# Patient Record
Sex: Female | Born: 1950 | Race: White | Hispanic: No | Marital: Married | Smoking: Never smoker
Health system: Southern US, Community
[De-identification: ages and names within clinical notes are randomized; demographics above are authoritative.]

## PROBLEM LIST (undated history)

## (undated) DIAGNOSIS — E785 Hyperlipidemia, unspecified: Secondary | ICD-10-CM

## (undated) HISTORY — PX: DILATION AND CURETTAGE OF UTERUS: SHX78

---

## 1999-01-27 ENCOUNTER — Other Ambulatory Visit: Admission: RE | Admit: 1999-01-27 | Discharge: 1999-01-27 | Payer: Self-pay | Admitting: Obstetrics & Gynecology

## 2000-02-03 ENCOUNTER — Other Ambulatory Visit: Admission: RE | Admit: 2000-02-03 | Discharge: 2000-02-03 | Payer: Self-pay | Admitting: Obstetrics & Gynecology

## 2000-02-06 ENCOUNTER — Other Ambulatory Visit: Admission: RE | Admit: 2000-02-06 | Discharge: 2000-02-06 | Payer: Self-pay | Admitting: Obstetrics & Gynecology

## 2000-02-06 ENCOUNTER — Encounter (INDEPENDENT_AMBULATORY_CARE_PROVIDER_SITE_OTHER): Payer: Self-pay

## 2001-02-13 ENCOUNTER — Other Ambulatory Visit: Admission: RE | Admit: 2001-02-13 | Discharge: 2001-02-13 | Payer: Self-pay | Admitting: Obstetrics & Gynecology

## 2002-03-27 ENCOUNTER — Other Ambulatory Visit: Admission: RE | Admit: 2002-03-27 | Discharge: 2002-03-27 | Payer: Self-pay | Admitting: Obstetrics & Gynecology

## 2003-06-24 ENCOUNTER — Other Ambulatory Visit: Admission: RE | Admit: 2003-06-24 | Discharge: 2003-06-24 | Payer: Self-pay | Admitting: Obstetrics & Gynecology

## 2005-01-02 ENCOUNTER — Other Ambulatory Visit: Admission: RE | Admit: 2005-01-02 | Discharge: 2005-01-02 | Payer: Self-pay | Admitting: Obstetrics & Gynecology

## 2005-05-24 ENCOUNTER — Encounter: Admission: RE | Admit: 2005-05-24 | Discharge: 2005-05-24 | Payer: Self-pay | Admitting: Obstetrics & Gynecology

## 2006-05-28 ENCOUNTER — Encounter: Admission: RE | Admit: 2006-05-28 | Discharge: 2006-05-28 | Payer: Self-pay | Admitting: Obstetrics & Gynecology

## 2006-11-14 ENCOUNTER — Emergency Department (HOSPITAL_COMMUNITY): Admission: EM | Admit: 2006-11-14 | Discharge: 2006-11-14 | Payer: Self-pay | Admitting: Family Medicine

## 2007-09-27 ENCOUNTER — Encounter: Admission: RE | Admit: 2007-09-27 | Discharge: 2007-09-27 | Payer: Self-pay | Admitting: Obstetrics & Gynecology

## 2007-10-08 ENCOUNTER — Encounter: Admission: RE | Admit: 2007-10-08 | Discharge: 2007-10-08 | Payer: Self-pay | Admitting: Obstetrics & Gynecology

## 2008-05-08 ENCOUNTER — Ambulatory Visit (HOSPITAL_BASED_OUTPATIENT_CLINIC_OR_DEPARTMENT_OTHER): Admission: RE | Admit: 2008-05-08 | Discharge: 2008-05-08 | Payer: Self-pay | Admitting: Orthopedic Surgery

## 2009-01-06 ENCOUNTER — Encounter: Admission: RE | Admit: 2009-01-06 | Discharge: 2009-01-06 | Payer: Self-pay | Admitting: Obstetrics & Gynecology

## 2010-03-21 ENCOUNTER — Encounter: Payer: Self-pay | Admitting: Obstetrics & Gynecology

## 2010-05-20 ENCOUNTER — Other Ambulatory Visit: Payer: Self-pay | Admitting: Obstetrics & Gynecology

## 2010-05-20 DIAGNOSIS — Z1231 Encounter for screening mammogram for malignant neoplasm of breast: Secondary | ICD-10-CM

## 2010-06-24 ENCOUNTER — Ambulatory Visit
Admission: RE | Admit: 2010-06-24 | Discharge: 2010-06-24 | Disposition: A | Discharge status: Home / Self Care | Payer: 59 | Source: Ambulatory Visit | Attending: Obstetrics & Gynecology | Admitting: Obstetrics & Gynecology

## 2010-06-24 DIAGNOSIS — Z1231 Encounter for screening mammogram for malignant neoplasm of breast: Secondary | ICD-10-CM

## 2010-07-12 NOTE — Op Note (Signed)
NAMEFATMA, RUTTEN NO.:  [id number]   MEDICAL RECORD NO.:  [id number]          PATIENT TYPE:  AMB   LOCATION:  DSC                          FACILITY:  [facility]   PHYSICIAN:  Cindee Salt, M.D.       DATE OF BIRTH:  03-04-1950   DATE OF PROCEDURE:  DATE OF DISCHARGE:                               OPERATIVE REPORT   PREOPERATIVE DIAGNOSIS:  Distal radius fracture, right wrist.   POSTOPERATIVE DIAGNOSIS:  Distal radius fracture, right wrist.   OPERATION:  Open reduction and internal fixation with fixation of DDR  standard plate.   SURGEON:  Cindee Salt, MD   ASSISTANT:  Carolyne Fiscal.   ANESTHESIA:  Axillary general.   DATE OF OPERATION:  May 08, 2008.   ANESTHESIOLOGIST:  Dr. Jean Rosenthal.   HISTORY:  The patient is a 60 year old female who suffered a fall with  fracture, comminuted intra-articular of her right distal radius.  She is  admitted now for open reduction and internal fixation.  She is aware of  risks and complications including infection, recurrence of injury to  arteries, nerves, tendons, incomplete relief of symptoms, dystrophy,  nonunion, loss of fixation, hardware problems, possibility of removal of  plate and screws.  In the preoperative area, the patient is seen.  The  extremity marked by both the patient and surgeon.  Antibiotic given.   PROCEDURE:  The patient was brought to the operating room where an  axillary block was carried out without difficulty.  She was prepped  using DuraPrep, supine position, right arm free.  A time-out taken.  The  limb was exsanguinated with an Esmarch bandage.  Tourniquet placed high  and the arm was inflated to 250 mmHg.  A reduction was performed,  confirmed under image intensification.  The incision was made, carried  down through subcutaneous tissue.  Bleeders were electrocauterized.  This was based on the flexor carpi radialis tendon.  Taken radially and  then distally for the possibility of extended dissection.   Dissection  carried down through the flexor carpi radialis tendon sheath.  The  radial artery was identified and protected.  Pronator quadratus was then  elevated off from the distal radius.  Fracture fragments were  immediately apparent.  This was manipulated in to position.  The ulnar  fragment was somewhat recalcitrant to reduction; however, with the plate  this will be maintained in position.  A DDR plate was then selected.  The small plate was not wide enough to cover both the radial and ulnar  fragments.  A wide plate was then placed.  This was fixed proximally  with a gliding screw after placement with K-wires confirming position.  The 12-mm screw was placed.  The plate was then adjusted and retained  with the fracture in a reduced position.  This firmly fixed the ulnar  fragment in position and care was taken to be certain that this did not  involve the distal radioulnar joint.  The distal screws were placed.  The first screw was a fully threaded locking screw.  This was measured  20 and 18  mm screw was placed, be certain that it did not penetrate the  dorsal cortex.  X-ray was confirmed, but this was not in the distal  radioulnar joint.  The remaining screws were placed.  The next screws  were placed as pegs locking into the distal radius.  These measured  between 18 and 22 mm.  The radial 3 screws were each drilled and  measured.  These measured 18-20 mm and were placed as fully threaded  screws and that they did not penetrate dorsal cortex.  X-rays confirmed  that no screws were in the distal radioulnar joint.  No screws were in  the radiocarpal joint.  With good fixation and realignment of the  articular surface with AP and lateral direction, the remaining proximal  screws were placed.  These measured 12, 12, 10, and 12.  The 12-mm screw  proximally was noted to be slightly long, so it was placed slightly  obliquely.  This was replaced with a 10-mm screw.  This firmly fixed  the  fracture in position, full flexion/extension, radial and ulnar  deviation, pronation and supination of the wrist was afforded.  The  wound was copiously irrigated with saline.  The pronator quadratus was  then repaired with figure-of-eight 4-0 Vicryl sutures, the subcutaneous  tissue with interrupted 4-0 Vicryl, and the skin with interrupted 4-0  Vicryl Rapide sutures.  A sterile compressive dressing and volar splint  was applied.  The patient tolerated the procedure well.  Deflation of  the tourniquet all fingers immediately pinked.  Electrocautery was used  throughout the procedure using bipolar cauterization for hemostasis.  The patient tolerated the procedure well and was taken to the recovery  room for observation in satisfactory condition.  She will be discharged  home to return to the [facility] in 1 week on Percocet.           ______________________________  Cindee Salt, M.D.     GK/MEDQ  D:  05/08/2008  T:  05/09/2008  Job:  [id number]

## 2011-09-01 ENCOUNTER — Encounter (HOSPITAL_COMMUNITY): Payer: Self-pay | Admitting: *Deleted

## 2011-09-01 ENCOUNTER — Emergency Department (HOSPITAL_COMMUNITY): Payer: 59

## 2011-09-01 ENCOUNTER — Other Ambulatory Visit: Payer: Self-pay

## 2011-09-01 ENCOUNTER — Observation Stay (HOSPITAL_COMMUNITY)
Admission: EM | Admit: 2011-09-01 | Discharge: 2011-09-02 | Disposition: A | Discharge status: Home / Self Care | Payer: 59 | Attending: Emergency Medicine | Admitting: Emergency Medicine

## 2011-09-01 DIAGNOSIS — R079 Chest pain, unspecified: Principal | ICD-10-CM | POA: Insufficient documentation

## 2011-09-01 DIAGNOSIS — R61 Generalized hyperhidrosis: Secondary | ICD-10-CM | POA: Insufficient documentation

## 2011-09-01 DIAGNOSIS — E785 Hyperlipidemia, unspecified: Secondary | ICD-10-CM | POA: Insufficient documentation

## 2011-09-01 HISTORY — DX: Hyperlipidemia, unspecified: E78.5

## 2011-09-01 LAB — CBC
HCT: 40.3 % (ref 36.0–46.0)
Hemoglobin: 13.1 g/dL (ref 12.0–15.0)
MCH: 29.3 pg (ref 26.0–34.0)
MCHC: 32.5 g/dL (ref 30.0–36.0)
Platelets: 276 10*3/uL (ref 150–400)
RBC: 4.47 MIL/uL (ref 3.87–5.11)
RDW: 13.2 % (ref 11.5–15.5)

## 2011-09-01 LAB — BASIC METABOLIC PANEL
BUN: 20 mg/dL (ref 6–23)
CO2: 28 mEq/L (ref 19–32)
Calcium: 9.4 mg/dL (ref 8.4–10.5)
Creatinine, Ser: 0.64 mg/dL (ref 0.50–1.10)
Sodium: 141 mEq/L (ref 135–145)

## 2011-09-01 LAB — TROPONIN I: Troponin I: <0.3 ng/mL (ref <0.30)

## 2011-09-01 NOTE — ED Notes (Signed)
Pt ambulatory to bathroom without any problems 

## 2011-09-01 NOTE — ED Provider Notes (Signed)
Medical screening examination/treatment/procedure(s) were conducted as a shared visit with non-physician practitioner(s) and myself.  I personally evaluated the patient during the encounter  Pt seen and examined, ecg without ischemic changes--troponin neg--will arrange f/u testing or obs overnight  Toy Baker, MD 09/01/11 1540

## 2011-09-01 NOTE — ED Notes (Signed)
Old and new EKG given to Dr. Freida Busman  Extra copies placed in pt chart

## 2011-09-01 NOTE — ED Notes (Signed)
Patient reports she was at work and had sudden onset of chest pain and she had onset of diaphoresis.  She states she had not eaten.  Patient states the pain lasted 10 min.  Patient states she feels better now.  She did eat. But she states she feels jittery.  Patient denies sob.  Denies nausea.

## 2011-09-01 NOTE — ED Notes (Signed)
Report received, assumed care.  

## 2011-09-01 NOTE — ED Provider Notes (Signed)
History     CSN: [id number]  Arrival date & time 09/01/11  1205   First MD Initiated Contact with Patient 09/01/11 1218      Chief Complaint  Patient presents with  . Chest Pain    (Consider location/radiation/quality/duration/timing/severity/associated sxs/prior treatment) HPI History from patient. 61 year old female who presents with chest pain. She states that she was standing at work at approximately 8:30 this morning when she had a sudden onset of chest pain. This was described as pressure radiating across her entire chest. It did not radiate to her back, neck, jaw, or arms. No associated abdominal pain. The event was associated with diaphoresis. The total event lasted for about 10 minutes and it has not recurred. Has never had anything like this before. No associated palpitations, nausea, vomiting, shortness of breath. Patient does report that she had not eaten breakfast this morning, but her blood glucose was checked soon afterwards and was 102. She has a past medical history of hyperlipidemia but denies history of CAD. She has no family history of CAD. She does not smoke.   She was initially seen at urgent care and given 325 mg aspirin.  Past Medical History  Diagnosis Date  . Hyperlipemia     Past Surgical History  Procedure Date  . Dilation and curettage of uterus     No family history on file.  History  Substance Use Topics  . Smoking status: Never Smoker   . Smokeless tobacco: Not on file  . Alcohol Use: Yes    OB History    Grav Para Term Preterm Abortions TAB SAB Ect Mult Living                  Review of Systems  Constitutional: Negative for fever, chills, activity change and appetite change.  Respiratory: Negative for cough and shortness of breath.   Cardiovascular: Positive for chest pain. Negative for palpitations and leg swelling.  Gastrointestinal: Negative for nausea, vomiting and abdominal pain.  Musculoskeletal: Negative for myalgias.  Skin:  Negative for color change and rash.  Neurological: Negative for dizziness and weakness.  All other systems reviewed and are negative.    Allergies  Review of patient's allergies indicates no known allergies.  Home Medications   Current Outpatient Rx  Name Route Sig Dispense Refill  . ATORVASTATIN CALCIUM 20 MG PO TABS Oral Take 20 mg by mouth daily.    Marland Kitchen VITAMIN D 1000 UNITS PO TABS Oral Take 1,000 Units by mouth daily.    Marland Kitchen EZETIMIBE 10 MG PO TABS Oral Take 10 mg by mouth every other day.    . OMEGA-3 FATTY ACIDS 1000 MG PO CAPS Oral Take 1 g by mouth daily.    Marland Kitchen RALOXIFENE HCL 60 MG PO TABS Oral Take 60 mg by mouth daily.      BP 132/87  Pulse 88  Temp 98 F (36.7 C) (Oral)  Resp 16  Ht 5' 2.75" (1.594 m)  Wt 120 lb (54.432 kg)  BMI 21.43 kg/m2  SpO2 98%  Physical Exam  Nursing note and vitals reviewed. Constitutional: She is oriented to person, place, and time. She appears well-developed and well-nourished. No distress.  HENT:  Head: Normocephalic and atraumatic.  Eyes:       Normal appearance  Neck: Normal range of motion.  Cardiovascular: Normal rate, regular rhythm and normal heart sounds.  Exam reveals no gallop and no friction rub.   No murmur heard. Pulmonary/Chest: Effort normal and breath sounds normal. She  exhibits no tenderness.  Abdominal: Soft. Bowel sounds are normal. There is no tenderness. There is no rebound and no guarding.  Musculoskeletal: Normal range of motion.  Neurological: She is alert and oriented to person, place, and time. No cranial nerve deficit.  Skin: Skin is warm and dry. She is not diaphoretic.  Psychiatric: She has a normal mood and affect.    ED Course  Procedures (including critical care time)   Date: 09/01/2011  Rate: 88  Rhythm: normal sinus rhythm  QRS Axis: normal  Intervals: normal  ST/T Wave abnormalities: normal  Conduction Disutrbances:none  Narrative Interpretation:   Old EKG Reviewed: compared with ECG from  [facility] earlier in day, unchanged   Labs Reviewed  CBC  BASIC METABOLIC PANEL  TROPONIN I  TROPONIN I   Dg Chest 2 View  09/01/2011  *RADIOLOGY REPORT*  Clinical Data: Chest pain  CHEST - 2 VIEW  Comparison: None.  Findings: Artifact overlies chest.  Heart size is normal. Mediastinal shadows are normal.  The lungs are clear.  No effusions.  Ordinary degenerative changes effect the spine.  IMPRESSION: No active disease  Original Report Authenticated By: Thomasenia Sales, M.D.     No diagnosis found.    MDM  Results a 10 minute episode of chest pressure and diaphoresis this morning which spontaneously resolved. Patient has no history of CAD. No associated shortness of breath, palpitations, nausea/vomiting. Patient's initial troponin is negative. EKG without worrisome findings. Other than hyperlipidemia, patient has no risk factors for CAD. She is a good candidate for chest pain protocol. Patient to be moved to CDU for coronary CT tomorrow and is agreeable to this. Case d/w Dr. Freida Busman and Lauralee Evener, PA-C in CDU.        Grant Fontana, PA-C 09/01/11 1550

## 2011-09-02 ENCOUNTER — Observation Stay (HOSPITAL_COMMUNITY): Payer: 59

## 2011-09-02 ENCOUNTER — Other Ambulatory Visit (HOSPITAL_COMMUNITY): Payer: 59

## 2011-09-02 MED ORDER — IOHEXOL 350 MG/ML SOLN
80.0000 mL | Freq: Once | INTRAVENOUS | Status: AC | PRN
  Administered 2011-09-02: 80 mL via INTRAVENOUS

## 2011-09-02 MED ORDER — METOPROLOL TARTRATE 25 MG PO TABS
100.0000 mg | ORAL_TABLET | Freq: Once | ORAL | Status: AC
  Administered 2011-09-02: 100 mg via ORAL
  Filled 2011-09-02: qty 4

## 2011-09-02 MED ORDER — ACETAMINOPHEN 325 MG PO TABS: 650.0000 mg | ORAL_TABLET | Freq: Once | ORAL | Status: DC

## 2011-09-02 MED ORDER — NITROGLYCERIN 0.4 MG SL SUBL
0.4000 mg | SUBLINGUAL_TABLET | Freq: Once | SUBLINGUAL | Status: AC
  Administered 2011-09-02: 0.4 mg via SUBLINGUAL
  Filled 2011-09-02: qty 25

## 2011-09-02 MED ORDER — METOPROLOL TARTRATE 1 MG/ML IV SOLN
5.0000 mg | Freq: Once | INTRAVENOUS | Status: AC
  Administered 2011-09-02: 5 mg via INTRAVENOUS
  Filled 2011-09-02: qty 10

## 2011-09-02 NOTE — ED Notes (Signed)
EKG done

## 2011-09-02 NOTE — ED Provider Notes (Signed)
Care of the patient re-assumed in the CDU, on CP protocol. Discussed the coronary CT findings with the radiologist. Negative study. Patient will be discharged home with instruction to followup with her primary care provider. Reasons to return to the emergency department discussed. She verbalized understanding and was agreeable with plan.  Grant Fontana, PA-C 09/02/11 1054

## 2011-09-02 NOTE — ED Provider Notes (Signed)
Medical screening examination/treatment/procedure(s) were conducted as a shared visit with non-physician practitioner(s) and myself.  I personally evaluated the patient during the encounter  Toy Baker, MD 09/02/11 [phone]

## 2012-05-15 ENCOUNTER — Other Ambulatory Visit: Payer: Self-pay

## 2012-06-13 ENCOUNTER — Ambulatory Visit
Admission: RE | Admit: 2012-06-13 | Discharge: 2012-06-13 | Disposition: A | Discharge status: Home / Self Care | Payer: 59 | Source: Ambulatory Visit

## 2012-06-13 DIAGNOSIS — Z1231 Encounter for screening mammogram for malignant neoplasm of breast: Secondary | ICD-10-CM

## 2013-05-26 ENCOUNTER — Telehealth: Payer: Self-pay | Admitting: *Deleted

## 2013-05-26 NOTE — Telephone Encounter (Signed)
I broke a bone in my foot in December.  I went to Urgent Care.  It's still swelling and painful.  Can't get an appointment until 06/12/13 with Dr. Charlsie Merlesegal.  I'm concerned about waiting so.  Maybe you can give me some suggestions or get me in with someone else.  I returned her call.  She stated she's a new patient.  Asked if I could make suggestions as to what to do until the 16th.  I told her I could not being that we have never seen her.  I told her we could get her in with another doctor.  I transferred her to a scheduler.

## 2013-05-27 ENCOUNTER — Ambulatory Visit (INDEPENDENT_AMBULATORY_CARE_PROVIDER_SITE_OTHER): Payer: 59

## 2013-05-27 VITALS — BP 134/86 | HR 90 | Resp 12

## 2013-05-27 DIAGNOSIS — R52 Pain, unspecified: Secondary | ICD-10-CM

## 2013-05-27 DIAGNOSIS — S92309A Fracture of unspecified metatarsal bone(s), unspecified foot, initial encounter for closed fracture: Secondary | ICD-10-CM

## 2013-05-27 DIAGNOSIS — S93409A Sprain of unspecified ligament of unspecified ankle, initial encounter: Secondary | ICD-10-CM

## 2013-05-27 NOTE — Patient Instructions (Signed)
ICE INSTRUCTIONS  Apply ice or cold pack to the affected area at least 3 times a day for 10-15 minutes each time.  You should also use ice after prolonged activity or vigorous exercise.  Do not apply ice longer than 20 minutes at one time.  Always keep a cloth between your skin and the ice pack to prevent burns.  Being consistent and following these instructions will help control your symptoms.  We suggest you purchase a gel ice pack because they are reusable and do bit leak.  Some of them are designed to wrap around the area.  Use the method that works best for you.  Here are some other suggestions for icing.   Use a frozen bag of peas or corn-inexpensive and molds well to your body, usually stays frozen for 10 to 20 minutes.  Wet a towel with cold water and squeeze out the excess until it's damp.  Place in a bag in the freezer for 20 minutes. Then remove and use.  Alternate hot and cold compresses 15 minutes of heat than 15 minutes of ice repeat 2 or 3 times every day  Also maintain a stable shoe or ankle stabilizer if walking uneven surfaces or uneven ground frenulum.

## 2013-05-27 NOTE — Progress Notes (Signed)
   Subjective:    Patient ID: Erica Hawkins, female    DOB: 08/17/1950, 63 y.o.   MRN: [id number]  HPI PT STATED FRACTURED THE LT FOOT IS SWOLLEN AND STILL HURTING FOR 4 MONTHS. THE FOOT IS NOT GETTING WORSE. THE FOOT GET AGGRAVATED BY WALKING. TRIED TO USED SURGICAL SHOES AND WRAP WITH TAPE AND HELP SOME.     Review of Systems  Musculoskeletal: Positive for joint swelling.  Allergic/Immunologic: Positive for environmental allergies.       Objective:   Physical Exam Neurovascular status is intact pedal pulses palpable epicritic and proprioceptive sensations intact and symmetric bilateral. Patient has some pain and tenderness lateral left ankle the tenderness is not as much of the base of the fifth metatarsal is it is in the lateral malleolar area and anterior to the lateral malleolus in the sinus tarsi area and somewhat posterior to the malleolus x-rays confirm a healing stress fracture Jones fracture of the fifth metatarsal left foot with apparent good consolidation. On palpation there is no pain on inversion eversion for flexion plantar flexion in the toe or palpation directly over the fifth metatarsal more the pain is significant in the sinus tarsi area and anterior gutter of the left ankle possibly ATF ligament injury as well as posse mild peroneal tendon injury.       Assessment & Plan:  Assessment healing Jones fracture fifth metatarsal left foot. Our there is also evidence of possible concurrent ankle sprain of the anterior talofibular ligament was still is painful tender symptomatic patient cases feels better when shoes are claw her dress shoes I suggested maintaining a stable firm soled shoe at all times maintain ankle stabilizer in the future if needed especially on uneven surfaces grass gravel or entering however suggested warm compress ice pack the affected area and to lever ibuprofen as needed for pain followup with in 3 months if fails to improve significantly indicate other  noninvasive study consider MRI except for. Again patient did have a healing Jones fracture but also lateral ankle sprain is identified  Alvan Dameichard Elandra Powell DPM

## 2013-06-12 ENCOUNTER — Ambulatory Visit: Payer: Self-pay | Admitting: Podiatry

## 2013-12-25 ENCOUNTER — Other Ambulatory Visit: Payer: Self-pay

## 2013-12-25 DIAGNOSIS — Z1239 Encounter for other screening for malignant neoplasm of breast: Secondary | ICD-10-CM

## 2014-01-09 ENCOUNTER — Other Ambulatory Visit: Payer: Self-pay

## 2014-01-09 ENCOUNTER — Ambulatory Visit
Admission: RE | Admit: 2014-01-09 | Discharge: 2014-01-09 | Disposition: A | Discharge status: Home / Self Care | Payer: 59 | Source: Ambulatory Visit

## 2014-01-09 DIAGNOSIS — Z1231 Encounter for screening mammogram for malignant neoplasm of breast: Secondary | ICD-10-CM

## 2014-01-12 ENCOUNTER — Other Ambulatory Visit: Payer: Self-pay | Admitting: Family Medicine

## 2014-01-12 DIAGNOSIS — R928 Other abnormal and inconclusive findings on diagnostic imaging of breast: Secondary | ICD-10-CM

## 2014-01-28 ENCOUNTER — Ambulatory Visit
Admission: RE | Admit: 2014-01-28 | Discharge: 2014-01-28 | Disposition: A | Discharge status: Home / Self Care | Payer: 59 | Source: Ambulatory Visit | Attending: Family Medicine | Admitting: Family Medicine

## 2014-01-28 DIAGNOSIS — R928 Other abnormal and inconclusive findings on diagnostic imaging of breast: Secondary | ICD-10-CM

## 2014-12-23 ENCOUNTER — Other Ambulatory Visit: Payer: Self-pay

## 2014-12-23 DIAGNOSIS — Z1231 Encounter for screening mammogram for malignant neoplasm of breast: Secondary | ICD-10-CM

## 2015-02-03 ENCOUNTER — Ambulatory Visit
Admission: RE | Admit: 2015-02-03 | Discharge: 2015-02-03 | Disposition: A | Discharge status: Home / Self Care | Payer: 59 | Source: Ambulatory Visit

## 2015-02-03 DIAGNOSIS — Z1231 Encounter for screening mammogram for malignant neoplasm of breast: Secondary | ICD-10-CM

## 2016-04-03 ENCOUNTER — Other Ambulatory Visit: Payer: Self-pay | Admitting: Obstetrics & Gynecology

## 2016-04-03 DIAGNOSIS — Z1231 Encounter for screening mammogram for malignant neoplasm of breast: Secondary | ICD-10-CM

## 2016-05-03 ENCOUNTER — Ambulatory Visit: Payer: 59

## 2016-05-05 IMAGING — MG MM SCREEN MAMMOGRAM BILATERAL
4 series · 4 of 4 positions shown · non-contrast
Comparison: Previous exam(s)

CLINICAL DATA: Screening.

EXAM:
DIGITAL SCREENING BILATERAL MAMMOGRAM WITH CAD

[R CC]
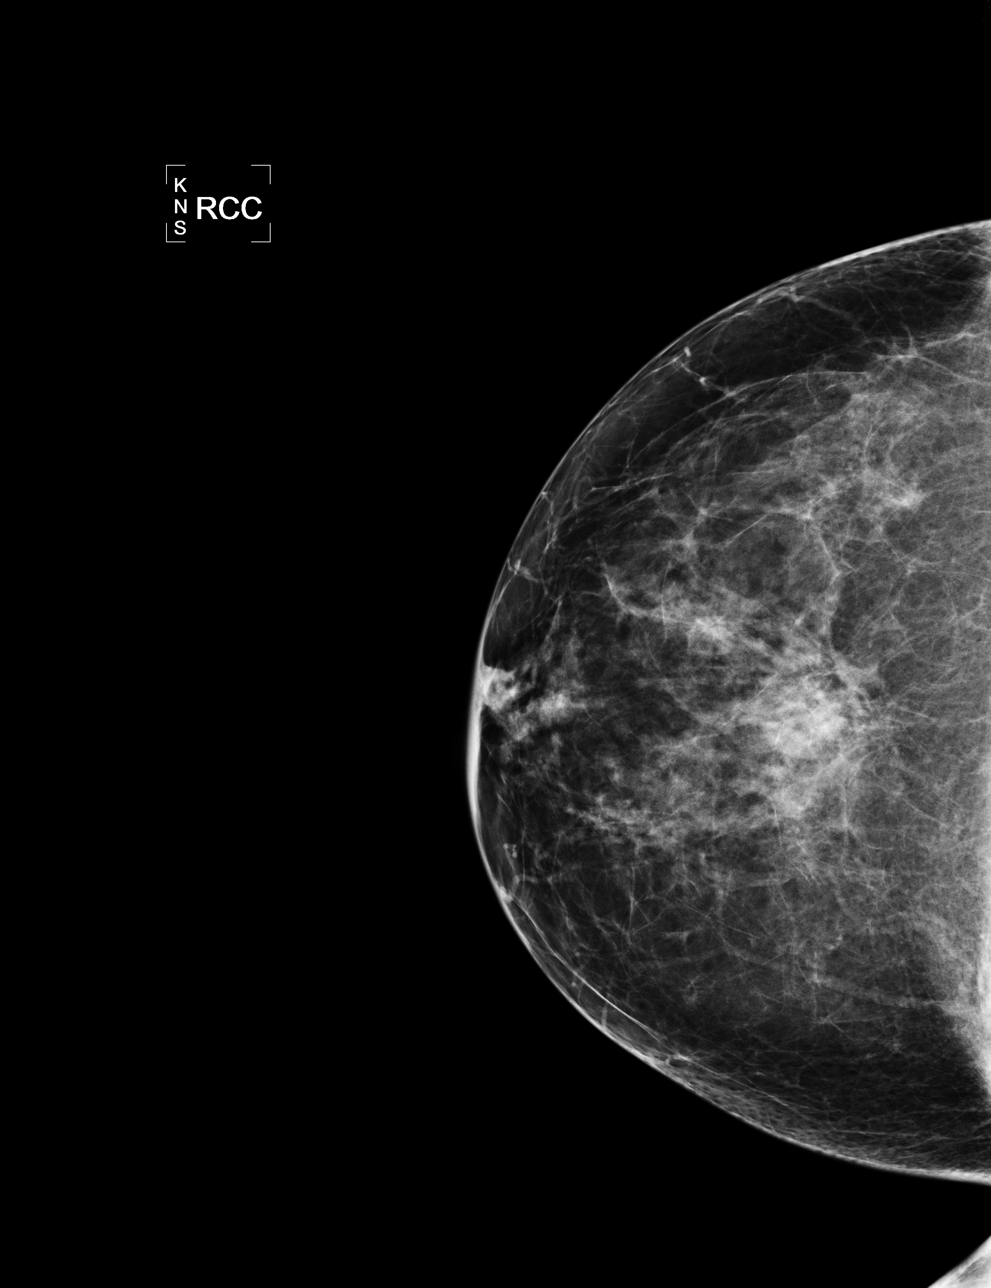

[L CC]
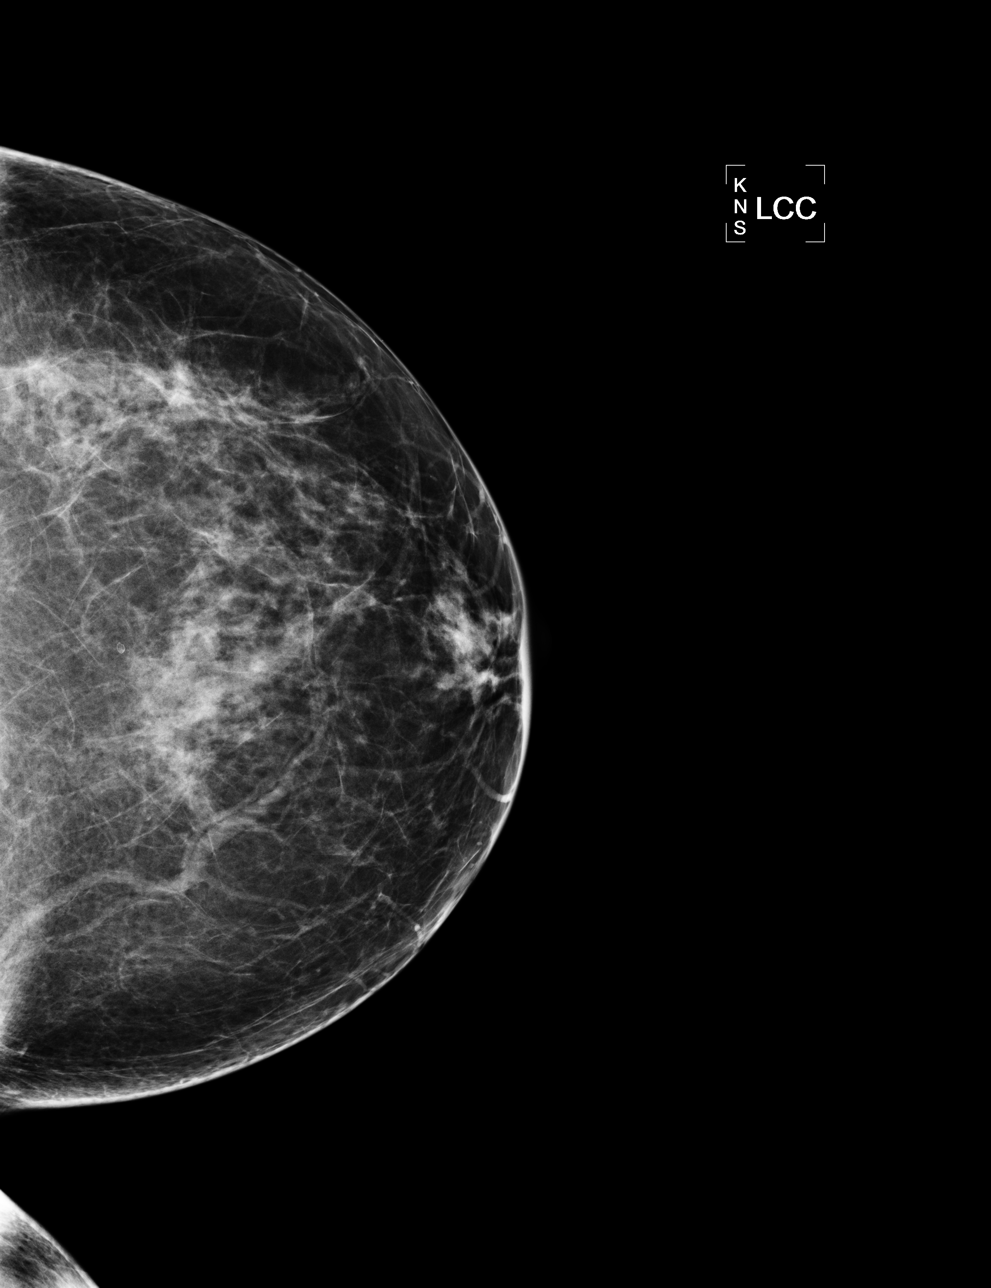

[L MLO]
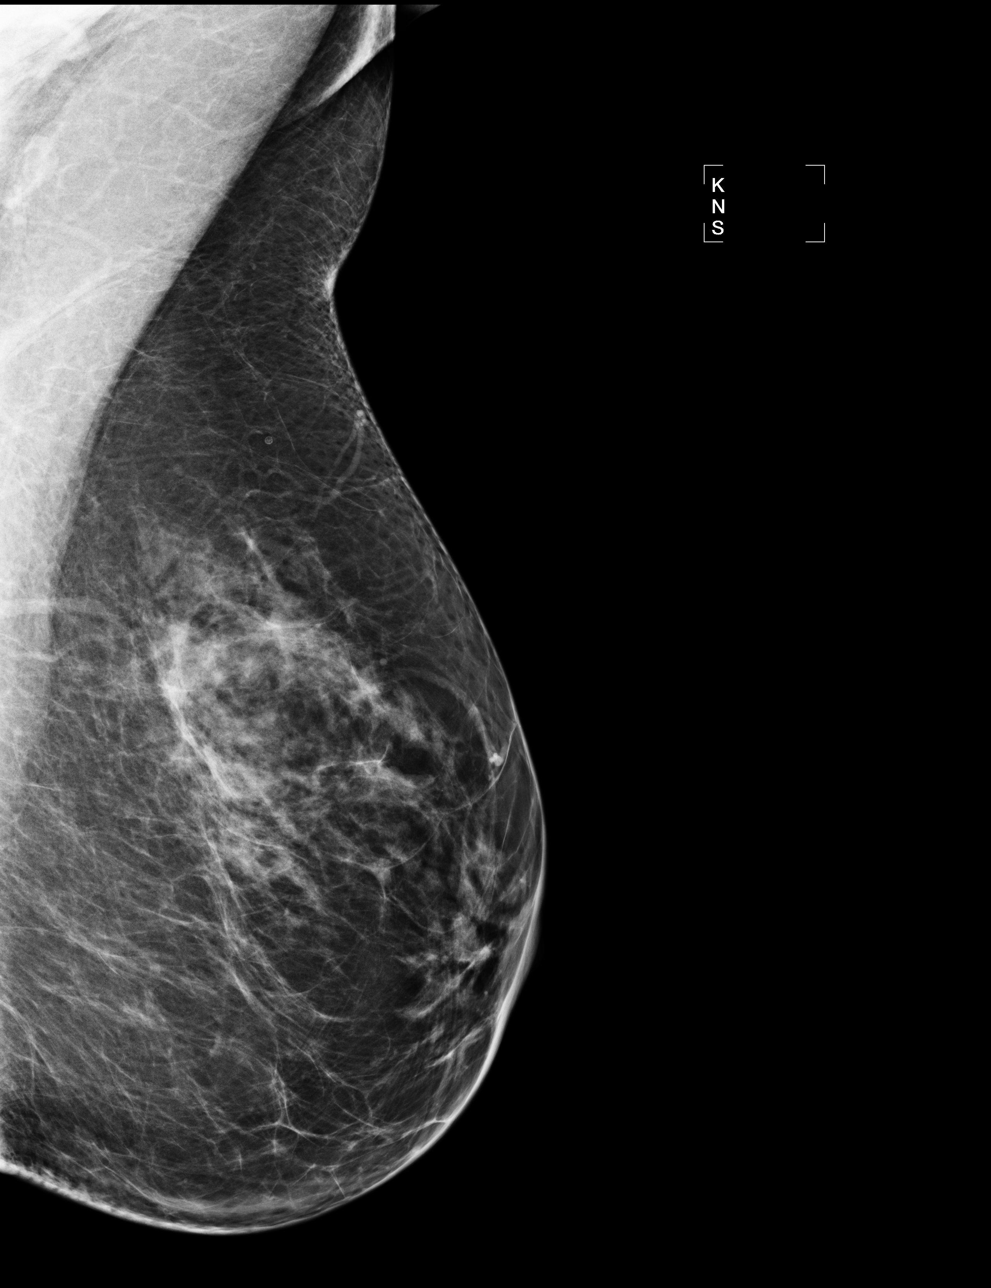

[R MLO]
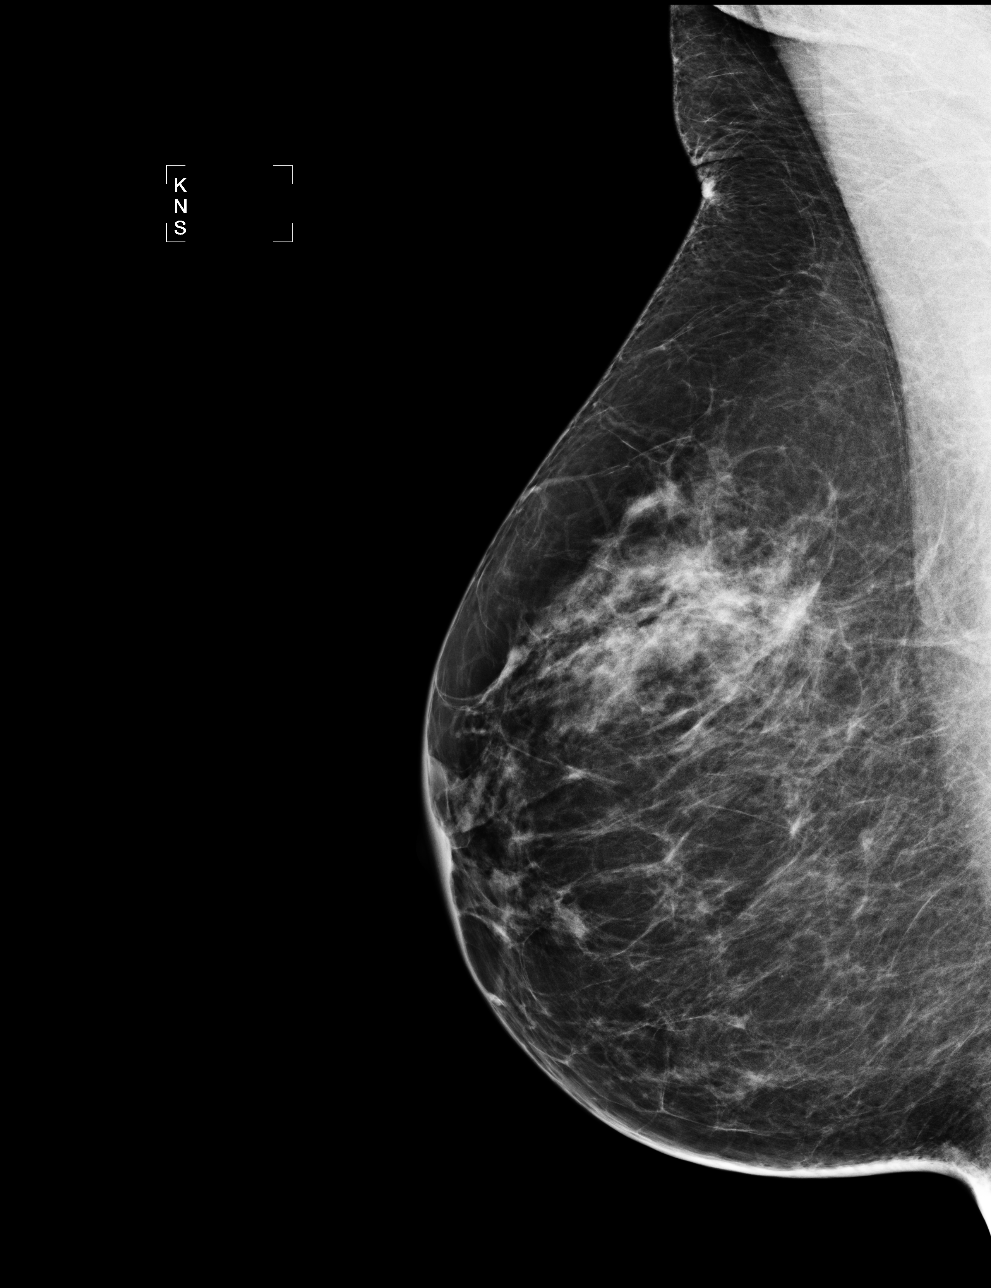

[4 of 4 positions shown; findings below may reference images not displayed]

ACR Breast Density Category b: There are scattered areas of
fibroglandular density.
FINDINGS: In the right breast, a possible mass warrants further evaluation
with spot compression views and possibly ultrasound. In the left
breast, no findings suspicious for malignancy. Images were processed
with CAD.
IMPRESSION: Further evaluation is suggested for possible mass in the right
breast.

RECOMMENDATION:
Diagnostic mammogram and possibly ultrasound of the right breast.
(Code:KG-2-55L)

The patient will be contacted regarding the findings, and additional
imaging will be scheduled.

BI-RADS CATEGORY  0: Incomplete. Need additional imaging evaluation
and/or prior mammograms for comparison.

## 2016-05-24 ENCOUNTER — Ambulatory Visit
Admission: RE | Admit: 2016-05-24 | Discharge: 2016-05-24 | Disposition: A | Discharge status: Home / Self Care | Payer: 59 | Source: Ambulatory Visit | Attending: Obstetrics & Gynecology | Admitting: Obstetrics & Gynecology

## 2016-05-24 DIAGNOSIS — Z1231 Encounter for screening mammogram for malignant neoplasm of breast: Secondary | ICD-10-CM

## 2017-06-28 ENCOUNTER — Other Ambulatory Visit: Payer: Self-pay | Admitting: Obstetrics & Gynecology

## 2017-06-28 DIAGNOSIS — Z1231 Encounter for screening mammogram for malignant neoplasm of breast: Secondary | ICD-10-CM

## 2017-08-02 ENCOUNTER — Ambulatory Visit
Admission: RE | Admit: 2017-08-02 | Discharge: 2017-08-02 | Disposition: A | Discharge status: Home / Self Care | Payer: 59 | Source: Ambulatory Visit | Attending: Obstetrics & Gynecology | Admitting: Obstetrics & Gynecology

## 2017-08-02 DIAGNOSIS — Z1231 Encounter for screening mammogram for malignant neoplasm of breast: Secondary | ICD-10-CM

## 2017-11-05 ENCOUNTER — Ambulatory Visit
Admission: RE | Admit: 2017-11-05 | Discharge: 2017-11-05 | Disposition: A | Discharge status: Home / Self Care | Payer: 59 | Source: Ambulatory Visit | Attending: Family Medicine | Admitting: Family Medicine

## 2017-11-05 ENCOUNTER — Other Ambulatory Visit: Payer: Self-pay | Admitting: Family Medicine

## 2017-11-05 DIAGNOSIS — R059 Cough, unspecified: Secondary | ICD-10-CM

## 2017-11-05 DIAGNOSIS — R05 Cough: Secondary | ICD-10-CM

## 2018-11-28 ENCOUNTER — Other Ambulatory Visit: Payer: Self-pay

## 2018-11-28 DIAGNOSIS — Z20822 Contact with and (suspected) exposure to covid-19: Secondary | ICD-10-CM

## 2018-11-29 LAB — NOVEL CORONAVIRUS, NAA: SARS-CoV-2, NAA: NOT DETECTED

## 2018-12-27 ENCOUNTER — Other Ambulatory Visit: Payer: Self-pay

## 2018-12-27 DIAGNOSIS — Z20822 Contact with and (suspected) exposure to covid-19: Secondary | ICD-10-CM

## 2018-12-28 LAB — NOVEL CORONAVIRUS, NAA: SARS-CoV-2, NAA: NOT DETECTED

## 2019-01-06 ENCOUNTER — Other Ambulatory Visit: Payer: Self-pay | Admitting: Obstetrics & Gynecology

## 2019-01-06 DIAGNOSIS — Z1231 Encounter for screening mammogram for malignant neoplasm of breast: Secondary | ICD-10-CM

## 2019-02-26 ENCOUNTER — Ambulatory Visit: Payer: 59

## 2019-02-26 ENCOUNTER — Other Ambulatory Visit: Payer: Self-pay

## 2019-02-26 ENCOUNTER — Ambulatory Visit
Admission: RE | Admit: 2019-02-26 | Discharge: 2019-02-26 | Disposition: A | Discharge status: Home / Self Care | Payer: 59 | Source: Ambulatory Visit | Attending: Obstetrics & Gynecology | Admitting: Obstetrics & Gynecology

## 2019-02-26 DIAGNOSIS — Z1231 Encounter for screening mammogram for malignant neoplasm of breast: Secondary | ICD-10-CM

## 2020-04-05 ENCOUNTER — Other Ambulatory Visit: Payer: Self-pay | Admitting: Radiology

## 2020-04-05 DIAGNOSIS — Z1231 Encounter for screening mammogram for malignant neoplasm of breast: Secondary | ICD-10-CM

## 2020-05-24 ENCOUNTER — Ambulatory Visit
Admission: RE | Admit: 2020-05-24 | Discharge: 2020-05-24 | Disposition: A | Discharge status: Home / Self Care | Payer: 59 | Source: Ambulatory Visit | Attending: Radiology | Admitting: Radiology

## 2020-05-24 ENCOUNTER — Other Ambulatory Visit: Payer: Self-pay

## 2020-05-24 DIAGNOSIS — Z1231 Encounter for screening mammogram for malignant neoplasm of breast: Secondary | ICD-10-CM

## 2021-05-11 ENCOUNTER — Other Ambulatory Visit: Payer: Self-pay | Admitting: Radiology

## 2021-05-11 DIAGNOSIS — Z1231 Encounter for screening mammogram for malignant neoplasm of breast: Secondary | ICD-10-CM

## 2021-05-31 ENCOUNTER — Ambulatory Visit
Admission: RE | Admit: 2021-05-31 | Discharge: 2021-05-31 | Disposition: A | Discharge status: Home / Self Care | Payer: 59 | Source: Ambulatory Visit | Attending: Radiology | Admitting: Radiology

## 2021-05-31 DIAGNOSIS — Z1231 Encounter for screening mammogram for malignant neoplasm of breast: Secondary | ICD-10-CM

## 2022-01-02 ENCOUNTER — Other Ambulatory Visit: Payer: Self-pay | Admitting: Radiology

## 2022-03-08 DIAGNOSIS — D2272 Melanocytic nevi of left lower limb, including hip: Secondary | ICD-10-CM | POA: Diagnosis not present

## 2022-03-08 DIAGNOSIS — D2222 Melanocytic nevi of left ear and external auricular canal: Secondary | ICD-10-CM | POA: Diagnosis not present

## 2022-03-08 DIAGNOSIS — D2261 Melanocytic nevi of right upper limb, including shoulder: Secondary | ICD-10-CM | POA: Diagnosis not present

## 2022-03-08 DIAGNOSIS — D224 Melanocytic nevi of scalp and neck: Secondary | ICD-10-CM | POA: Diagnosis not present

## 2022-03-08 DIAGNOSIS — L821 Other seborrheic keratosis: Secondary | ICD-10-CM | POA: Diagnosis not present

## 2022-03-08 DIAGNOSIS — D2271 Melanocytic nevi of right lower limb, including hip: Secondary | ICD-10-CM | POA: Diagnosis not present

## 2022-03-08 DIAGNOSIS — D1801 Hemangioma of skin and subcutaneous tissue: Secondary | ICD-10-CM | POA: Diagnosis not present

## 2022-03-08 DIAGNOSIS — D225 Melanocytic nevi of trunk: Secondary | ICD-10-CM | POA: Diagnosis not present

## 2022-03-08 DIAGNOSIS — D2262 Melanocytic nevi of left upper limb, including shoulder: Secondary | ICD-10-CM | POA: Diagnosis not present

## 2022-03-08 DIAGNOSIS — L812 Freckles: Secondary | ICD-10-CM | POA: Diagnosis not present

## 2022-08-17 ENCOUNTER — Other Ambulatory Visit: Payer: Self-pay | Admitting: Internal Medicine

## 2022-08-17 DIAGNOSIS — Z1231 Encounter for screening mammogram for malignant neoplasm of breast: Secondary | ICD-10-CM

## 2022-08-18 DIAGNOSIS — K219 Gastro-esophageal reflux disease without esophagitis: Secondary | ICD-10-CM | POA: Diagnosis not present

## 2022-08-18 DIAGNOSIS — E782 Mixed hyperlipidemia: Secondary | ICD-10-CM | POA: Diagnosis not present

## 2022-08-18 DIAGNOSIS — Z86010 Personal history of colonic polyps: Secondary | ICD-10-CM | POA: Diagnosis not present

## 2022-08-18 DIAGNOSIS — E559 Vitamin D deficiency, unspecified: Secondary | ICD-10-CM | POA: Diagnosis not present

## 2022-08-18 DIAGNOSIS — Z0000 Encounter for general adult medical examination without abnormal findings: Secondary | ICD-10-CM | POA: Diagnosis not present

## 2022-08-18 DIAGNOSIS — M1990 Unspecified osteoarthritis, unspecified site: Secondary | ICD-10-CM | POA: Diagnosis not present

## 2022-08-18 DIAGNOSIS — M859 Disorder of bone density and structure, unspecified: Secondary | ICD-10-CM | POA: Diagnosis not present

## 2022-08-23 DIAGNOSIS — N952 Postmenopausal atrophic vaginitis: Secondary | ICD-10-CM | POA: Diagnosis not present

## 2022-08-23 DIAGNOSIS — M810 Age-related osteoporosis without current pathological fracture: Secondary | ICD-10-CM | POA: Diagnosis not present

## 2022-08-23 DIAGNOSIS — Z6823 Body mass index (BMI) 23.0-23.9, adult: Secondary | ICD-10-CM | POA: Diagnosis not present

## 2022-08-23 DIAGNOSIS — Z01419 Encounter for gynecological examination (general) (routine) without abnormal findings: Secondary | ICD-10-CM | POA: Diagnosis not present

## 2022-09-28 DIAGNOSIS — M8588 Other specified disorders of bone density and structure, other site: Secondary | ICD-10-CM | POA: Diagnosis not present

## 2022-10-12 DIAGNOSIS — Z87891 Personal history of nicotine dependence: Secondary | ICD-10-CM | POA: Diagnosis not present

## 2022-10-12 DIAGNOSIS — Z008 Encounter for other general examination: Secondary | ICD-10-CM | POA: Diagnosis not present

## 2022-10-12 DIAGNOSIS — I10 Essential (primary) hypertension: Secondary | ICD-10-CM | POA: Diagnosis not present

## 2022-10-12 DIAGNOSIS — Z823 Family history of stroke: Secondary | ICD-10-CM | POA: Diagnosis not present

## 2022-10-12 DIAGNOSIS — Z79620 Long term (current) use of immunosuppressive biologic: Secondary | ICD-10-CM | POA: Diagnosis not present

## 2022-10-12 DIAGNOSIS — M810 Age-related osteoporosis without current pathological fracture: Secondary | ICD-10-CM | POA: Diagnosis not present

## 2022-10-12 DIAGNOSIS — M1990 Unspecified osteoarthritis, unspecified site: Secondary | ICD-10-CM | POA: Diagnosis not present

## 2022-10-12 DIAGNOSIS — E785 Hyperlipidemia, unspecified: Secondary | ICD-10-CM | POA: Diagnosis not present

## 2022-10-16 ENCOUNTER — Ambulatory Visit: Payer: 59

## 2022-10-16 ENCOUNTER — Ambulatory Visit
Admission: RE | Admit: 2022-10-16 | Discharge: 2022-10-16 | Disposition: A | Discharge status: Home / Self Care | Payer: Medicare HMO | Source: Ambulatory Visit | Attending: Internal Medicine | Admitting: Internal Medicine

## 2022-10-16 DIAGNOSIS — Z1231 Encounter for screening mammogram for malignant neoplasm of breast: Secondary | ICD-10-CM | POA: Diagnosis not present

## 2022-11-14 DIAGNOSIS — H25813 Combined forms of age-related cataract, bilateral: Secondary | ICD-10-CM | POA: Diagnosis not present

## 2022-11-14 DIAGNOSIS — H5203 Hypermetropia, bilateral: Secondary | ICD-10-CM | POA: Diagnosis not present

## 2023-01-30 DIAGNOSIS — M8580 Other specified disorders of bone density and structure, unspecified site: Secondary | ICD-10-CM | POA: Diagnosis not present

## 2023-02-08 DIAGNOSIS — M810 Age-related osteoporosis without current pathological fracture: Secondary | ICD-10-CM | POA: Diagnosis not present

## 2023-02-27 DIAGNOSIS — D224 Melanocytic nevi of scalp and neck: Secondary | ICD-10-CM | POA: Diagnosis not present

## 2023-02-27 DIAGNOSIS — D2271 Melanocytic nevi of right lower limb, including hip: Secondary | ICD-10-CM | POA: Diagnosis not present

## 2023-02-27 DIAGNOSIS — L821 Other seborrheic keratosis: Secondary | ICD-10-CM | POA: Diagnosis not present

## 2023-02-27 DIAGNOSIS — D2261 Melanocytic nevi of right upper limb, including shoulder: Secondary | ICD-10-CM | POA: Diagnosis not present

## 2023-02-27 DIAGNOSIS — D225 Melanocytic nevi of trunk: Secondary | ICD-10-CM | POA: Diagnosis not present

## 2023-02-27 DIAGNOSIS — D2222 Melanocytic nevi of left ear and external auricular canal: Secondary | ICD-10-CM | POA: Diagnosis not present

## 2023-02-27 DIAGNOSIS — D2272 Melanocytic nevi of left lower limb, including hip: Secondary | ICD-10-CM | POA: Diagnosis not present

## 2023-02-27 DIAGNOSIS — D2262 Melanocytic nevi of left upper limb, including shoulder: Secondary | ICD-10-CM | POA: Diagnosis not present

## 2023-02-27 DIAGNOSIS — L819 Disorder of pigmentation, unspecified: Secondary | ICD-10-CM | POA: Diagnosis not present

## 2023-02-27 DIAGNOSIS — D2371 Other benign neoplasm of skin of right lower limb, including hip: Secondary | ICD-10-CM | POA: Diagnosis not present

## 2023-02-27 DIAGNOSIS — D485 Neoplasm of uncertain behavior of skin: Secondary | ICD-10-CM | POA: Diagnosis not present

## 2023-08-10 DIAGNOSIS — W57XXXA Bitten or stung by nonvenomous insect and other nonvenomous arthropods, initial encounter: Secondary | ICD-10-CM | POA: Diagnosis not present

## 2023-08-10 DIAGNOSIS — T07XXXA Unspecified multiple injuries, initial encounter: Secondary | ICD-10-CM | POA: Diagnosis not present

## 2023-08-17 DIAGNOSIS — E559 Vitamin D deficiency, unspecified: Secondary | ICD-10-CM | POA: Diagnosis not present

## 2023-08-17 DIAGNOSIS — Z1329 Encounter for screening for other suspected endocrine disorder: Secondary | ICD-10-CM | POA: Diagnosis not present

## 2023-08-17 DIAGNOSIS — E039 Hypothyroidism, unspecified: Secondary | ICD-10-CM | POA: Diagnosis not present

## 2023-08-17 DIAGNOSIS — M859 Disorder of bone density and structure, unspecified: Secondary | ICD-10-CM | POA: Diagnosis not present

## 2023-08-17 DIAGNOSIS — E782 Mixed hyperlipidemia: Secondary | ICD-10-CM | POA: Diagnosis not present

## 2023-08-21 ENCOUNTER — Other Ambulatory Visit (HOSPITAL_BASED_OUTPATIENT_CLINIC_OR_DEPARTMENT_OTHER): Payer: Self-pay | Admitting: Internal Medicine

## 2023-08-21 DIAGNOSIS — I83891 Varicose veins of right lower extremities with other complications: Secondary | ICD-10-CM | POA: Diagnosis not present

## 2023-08-21 DIAGNOSIS — K219 Gastro-esophageal reflux disease without esophagitis: Secondary | ICD-10-CM | POA: Diagnosis not present

## 2023-08-21 DIAGNOSIS — M1990 Unspecified osteoarthritis, unspecified site: Secondary | ICD-10-CM | POA: Diagnosis not present

## 2023-08-21 DIAGNOSIS — Z136 Encounter for screening for cardiovascular disorders: Secondary | ICD-10-CM | POA: Diagnosis not present

## 2023-08-21 DIAGNOSIS — E559 Vitamin D deficiency, unspecified: Secondary | ICD-10-CM | POA: Diagnosis not present

## 2023-08-21 DIAGNOSIS — E782 Mixed hyperlipidemia: Secondary | ICD-10-CM | POA: Diagnosis not present

## 2023-08-21 DIAGNOSIS — R7301 Impaired fasting glucose: Secondary | ICD-10-CM | POA: Diagnosis not present

## 2023-08-21 DIAGNOSIS — M859 Disorder of bone density and structure, unspecified: Secondary | ICD-10-CM | POA: Diagnosis not present

## 2023-08-21 DIAGNOSIS — Z0000 Encounter for general adult medical examination without abnormal findings: Secondary | ICD-10-CM | POA: Diagnosis not present

## 2023-08-28 ENCOUNTER — Other Ambulatory Visit: Payer: Self-pay

## 2023-08-28 DIAGNOSIS — M810 Age-related osteoporosis without current pathological fracture: Secondary | ICD-10-CM | POA: Insufficient documentation

## 2023-09-03 ENCOUNTER — Telehealth: Payer: Self-pay

## 2023-09-03 NOTE — Telephone Encounter (Signed)
 Erica Hawkins called asking about copay information for the patient. We do not generally have the answer to copay questions since we can't give an exact dollar amount that will be the patients responsibility. I spoke to Erica Hawkins about this and we determined that her copay should be around $300 - $350 but can't give a definite answer for this. I called the patient and left her a voicemail.

## 2023-09-03 NOTE — Telephone Encounter (Signed)
 Auth Submission: APPROVED Site of care: Site of care: [facility] Payer: [employer] medicare Medication & CPT/J Code(s) submitted: Prolia  (Denosumab ) [id number] Diagnosis Code:  Route of submission (phone, fax, portal): portal Phone # Fax # Auth type: Buy/Bill PB Units/visits requested: 60mg  x 2 doses Reference number: [id number] Approval from: 08/29/23 to 08/28/24

## 2023-09-04 ENCOUNTER — Ambulatory Visit (INDEPENDENT_AMBULATORY_CARE_PROVIDER_SITE_OTHER): Admitting: *Deleted

## 2023-09-04 VITALS — BP 129/81 | HR 91 | Temp 97.9°F | Resp 16 | Ht 61.0 in | Wt 128.4 lb

## 2023-09-04 DIAGNOSIS — M810 Age-related osteoporosis without current pathological fracture: Secondary | ICD-10-CM

## 2023-09-04 MED ORDER — DENOSUMAB 60 MG/ML ~~LOC~~ SOSY
60.0000 mg | PREFILLED_SYRINGE | Freq: Once | SUBCUTANEOUS | Status: AC
  Administered 2023-09-04: 60 mg via SUBCUTANEOUS
  Filled 2023-09-04: qty 1

## 2023-09-04 NOTE — Progress Notes (Signed)
 Diagnosis: Osteoporosis  Provider:  Chilton Greathouse MD  Procedure: Injection  Prolia (Denosumab), Dose: 60 mg, Site: subcutaneous, Number of injections: 1  Injection Site(s): Right arm  Post Care: Observation period completed  Discharge: Condition: Good, Destination: Home . AVS Provided  Performed by:  Forrest Moron, RN

## 2023-09-04 NOTE — Patient Instructions (Signed)
 Denosumab Injection (Osteoporosis) What is this medication? DENOSUMAB (den oh SUE mab) prevents and treats osteoporosis. It works by Interior and spatial designer stronger and less likely to break (fracture). It is a monoclonal antibody. This medicine may be used for other purposes; ask your health care provider or pharmacist if you have questions. COMMON BRAND NAME(S): Prolia What should I tell my care team before I take this medication? They need to know if you have any of these conditions: Dental or gum disease Had thyroid or parathyroid (glands located in neck) surgery Having dental surgery or a tooth pulled Kidney disease Low levels of calcium in the blood On dialysis Poor nutrition Thyroid disease Trouble absorbing nutrients from your food An unusual or allergic reaction to denosumab, other medications, foods, dyes, or preservatives Pregnant or trying to get pregnant Breastfeeding How should I use this medication? This medication is injected under the skin. It is given by your care team in a hospital or clinic setting. A special MedGuide will be given to you before each treatment. Be sure to read this information carefully each time. Talk to your care team about the use of this medication in children. Special care may be needed. Overdosage: If you think you have taken too much of this medicine contact a poison control center or emergency room at once. NOTE: This medicine is only for you. Do not share this medicine with others. What if I miss a dose? Keep appointments for follow-up doses. It is important not to miss your dose. Call your care team if you are unable to keep an appointment. What may interact with this medication? Do not take this medication with any of the following: Other medications that contain denosumab This medication may also interact with the following: Medications that lower your chance of fighting infection Steroid medications, such as prednisone or cortisone This  list may not describe all possible interactions. Give your health care provider a list of all the medicines, herbs, non-prescription drugs, or dietary supplements you use. Also tell them if you smoke, drink alcohol, or use illegal drugs. Some items may interact with your medicine. What should I watch for while using this medication? Your condition will be monitored carefully while you are receiving this medication. You may need blood work done while taking this medication. This medication may increase your risk of getting an infection. Call your care team for advice if you get a fever, chills, sore throat, or other symptoms of a cold or flu. Do not treat yourself. Try to avoid being around people who are sick. Tell your dentist and dental surgeon that you are taking this medication. You should not have major dental surgery while on this medication. See your dentist to have a dental exam and fix any dental problems before starting this medication. Take good care of your teeth while on this medication. Make sure you see your dentist for regular follow-up appointments. This medication may cause low levels of calcium in your body. The risk of severe side effects is increased in people with kidney disease. Your care team may prescribe calcium and vitamin D to help prevent low calcium levels while you take this medication. It is important to take calcium and vitamin D as directed by your care team. Talk to your care team if you may be pregnant. Serious birth defects may occur if you take this medication during pregnancy and for 5 months after the last dose. You will need a negative pregnancy test before starting this medication. Contraception  is recommended while taking this medication and for 5 months after the last dose. Your care team can help you find the option that works for you. Talk to your care team before breastfeeding. Changes to your treatment plan may be needed. What side effects may I notice from  receiving this medication? Side effects that you should report to your care team as soon as possible: Allergic reactions--skin rash, itching, hives, swelling of the face, lips, tongue, or throat Infection--fever, chills, cough, sore throat, wounds that don't heal, pain or trouble when passing urine, general feeling of discomfort or being unwell Low calcium level--muscle pain or cramps, confusion, tingling, or numbness in the hands or feet Osteonecrosis of the jaw--pain, swelling, or redness in the mouth, numbness of the jaw, poor healing after dental work, unusual discharge from the mouth, visible bones in the mouth Severe bone, joint, or muscle pain Skin infection--skin redness, swelling, warmth, or pain Side effects that usually do not require medical attention (report these to your care team if they continue or are bothersome): Back pain Headache Joint pain Muscle pain Pain in the hands, arms, legs, or feet Runny or stuffy nose Sore throat This list may not describe all possible side effects. Call your doctor for medical advice about side effects. You may report side effects to FDA at 1-800-FDA-1088. Where should I keep my medication? This medication is given in a hospital or clinic. It will not be stored at home. NOTE: This sheet is a summary. It may not cover all possible information. If you have questions about this medicine, talk to your doctor, pharmacist, or health care provider.  2024 Elsevier/Gold Standard (2022-03-21 00:00:00)

## 2023-09-27 ENCOUNTER — Ambulatory Visit (HOSPITAL_BASED_OUTPATIENT_CLINIC_OR_DEPARTMENT_OTHER)
Admission: RE | Admit: 2023-09-27 | Discharge: 2023-09-27 | Disposition: A | Discharge status: Home / Self Care | Payer: Self-pay | Source: Ambulatory Visit | Attending: Internal Medicine | Admitting: Internal Medicine

## 2023-09-27 DIAGNOSIS — Z136 Encounter for screening for cardiovascular disorders: Secondary | ICD-10-CM | POA: Insufficient documentation

## 2023-09-28 ENCOUNTER — Other Ambulatory Visit: Payer: Self-pay | Admitting: Internal Medicine

## 2023-09-28 DIAGNOSIS — Z1231 Encounter for screening mammogram for malignant neoplasm of breast: Secondary | ICD-10-CM

## 2023-10-18 ENCOUNTER — Ambulatory Visit
Admission: RE | Admit: 2023-10-18 | Discharge: 2023-10-18 | Disposition: A | Discharge status: Home / Self Care | Source: Ambulatory Visit | Attending: Internal Medicine | Admitting: Internal Medicine

## 2023-10-18 ENCOUNTER — Other Ambulatory Visit: Payer: Self-pay

## 2023-10-18 DIAGNOSIS — Z1231 Encounter for screening mammogram for malignant neoplasm of breast: Secondary | ICD-10-CM | POA: Diagnosis not present

## 2023-10-18 DIAGNOSIS — M79604 Pain in right leg: Secondary | ICD-10-CM

## 2023-11-21 DIAGNOSIS — H5203 Hypermetropia, bilateral: Secondary | ICD-10-CM | POA: Diagnosis not present

## 2023-11-21 DIAGNOSIS — H2513 Age-related nuclear cataract, bilateral: Secondary | ICD-10-CM | POA: Diagnosis not present

## 2023-11-27 ENCOUNTER — Encounter: Payer: Self-pay | Admitting: Vascular Surgery

## 2023-11-27 ENCOUNTER — Ambulatory Visit (HOSPITAL_COMMUNITY)
Admission: RE | Admit: 2023-11-27 | Discharge: 2023-11-27 | Disposition: A | Discharge status: Home / Self Care | Source: Ambulatory Visit | Attending: Vascular Surgery | Admitting: Vascular Surgery

## 2023-11-27 ENCOUNTER — Ambulatory Visit: Admitting: Vascular Surgery

## 2023-11-27 VITALS — BP 143/94 | HR 77 | Temp 98.0°F | Ht 61.0 in | Wt 128.0 lb

## 2023-11-27 DIAGNOSIS — M79604 Pain in right leg: Secondary | ICD-10-CM | POA: Diagnosis not present

## 2023-11-27 DIAGNOSIS — I872 Venous insufficiency (chronic) (peripheral): Secondary | ICD-10-CM | POA: Insufficient documentation

## 2023-11-27 NOTE — Progress Notes (Signed)
 [facility] [place]  ASSESSMENT / PLAN: RAIMI GUILLERMO is a 73 y.o. 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  Venous duplex is significant for GSV reflux from SFJ --> calf Recommend compression and elevation for symptomatic relief. Encouraged patient to defer intervention for now given her minimal symptoms. Should symptoms worsen or become worrisome to her, she should follow-up with us .  CHIEF COMPLAINT: Prominent vein in right calf  HISTORY OF PRESENT ILLNESS: UNNAMED HINO is a 73 y.o. female who presents to clinic for evaluation of prominent varicosities in the right medial calf.  She is a fairly healthy woman with almost no medical issues.  She has had prominent veins in the right medial calf for some time.  She has become worried about these and hoped for further evaluation.  She reports no inflammatory skin changes or ulceration about the ankle.  She is only mildly bothered by the varicosities.  She reports no lower extremity swelling.   Past Medical History:  Diagnosis Date   Hyperlipemia     Past Surgical History:  Procedure Laterality Date   DILATION AND CURETTAGE OF UTERUS      Family History  Problem Relation Age of Onset   Breast cancer Neg Hx     Social History   Socioeconomic History   Marital status: Married    Spouse name: Not on file   Number of children: Not on file   Years of education: Not on file   Highest education level: Not on file  Occupational History   Not on file  Tobacco Use   Smoking status: Never   Smokeless tobacco: Not on file  Substance and Sexual Activity   Alcohol use: Yes   Drug use: No   Sexual activity: Not on file  Other Topics Concern   Not on file  Social History Narrative   Not on file   Social Drivers of Health   Financial Resource Strain: Not on file  Food Insecurity: Not on file  Transportation Needs: Not on file  Physical  Activity: Not on file  Stress: Not on file  Social Connections: Not on file  Intimate Partner Violence: Not on file    Allergies  Allergen Reactions   Ezetimibe-Simvastatin Other (See Comments)    Other Reaction(s): memory loss, Mental Status Changes  Memory loss and joint pain  Vytorin   Niacin Dermatitis and Other (See Comments)    Current Outpatient Medications  Medication Sig Dispense Refill   atorvastatin (LIPITOR) 20 MG tablet Take 20 mg by mouth daily.     cholecalciferol (VITAMIN D) 1000 UNITS tablet Take 1,000 Units by mouth daily.     fish oil-omega-3 fatty acids 1000 MG capsule Take 1 g by mouth daily.     PROLIA  60 MG/ML SOSY injection Inject 60 mg into the skin every 6 (six) months.     Vitamin D, Ergocalciferol, (DRISDOL) 50000 UNITS CAPS capsule      alendronate (FOSAMAX) 70 MG tablet      ezetimibe (ZETIA) 10 MG tablet Take 10 mg by mouth every other day.     raloxifene (EVISTA) 60 MG tablet Take 60 mg by mouth daily. (Patient not taking: Reported on 11/27/2023)     No current facility-administered medications for this visit.    PHYSICAL EXAM Vitals:   11/27/23 1439  BP: (!) 143/94  Pulse: 77  Temp: 98 F (36.7 C)  SpO2: 93%  Weight: 128 lb (  58.1 kg)  Height: 5' 1 (1.549 m)   Well-appearing elderly woman in no distress Regular rate and rhythm Unlabored breathing 2+ dorsalis pedis pulses bilaterally Prominent varicosities of the right medial calf  PERTINENT LABORATORY AND RADIOLOGIC DATA  Most recent CBC    Latest Ref Rng & Units 09/01/2011   12:32 PM 05/08/2008    1:29 PM  CBC  WBC 4.0 - 10.5 K/uL 8.0    Hemoglobin 12.0 - 15.0 g/dL 86.8  87.0   Hematocrit 36.0 - 46.0 % 40.3    Platelets 150 - 400 K/uL 276       Most recent CMP    Latest Ref Rng & Units 09/01/2011   12:32 PM  CMP  Glucose 70 - 99 mg/dL 89   BUN 6 - 23 mg/dL 20   Creatinine 9.49 - 1.10 mg/dL 9.35   Sodium 864 - 854 mEq/L 141   Potassium 3.5 - 5.1 mEq/L 3.8   Chloride 96  - 112 mEq/L 103   CO2 19 - 32 mEq/L 28   Calcium 8.4 - 10.5 mg/dL 9.4    Lower Venous Reflux Study   Patient Name:  NIKKY DUBA  Date of Exam:   11/27/2023  Medical Rec #: [id number]       Accession #:    [id number]  Date of Birth: [birth year]       Patient Gender: F  Patient Age:   21 years  Exam Location:  [address]  Procedure:      VAS US  LOWER EXTREMITY VENOUS REFLUX  Referring Phys: DEBBY ROBERTSON    ---------------------------------------------------------------------------  -----    Indications: Right calf varicose veins. Patient denies chest pain and SOB.    Comparison Study: None   Performing Technologist: Alecia Mackin RVT, RDCS (AE), RDMS     Examination Guidelines: A complete evaluation includes B-mode imaging,  spectral  Doppler, color Doppler, and power Doppler as needed of all accessible  portions  of each vessel. Bilateral testing is considered an integral part of a  complete  examination. Limited examinations for reoccurring indications may be  performed  as noted. The reflux portion of the exam is performed with the patient in  reverse Trendelenburg.  Significant venous reflux is defined as >500 ms in the superficial venous  system, and >1 second in the deep venous system.     Venous Reflux Times  +--------------+---------+------+-----------+------------+--------+  RIGHT        Reflux NoRefluxReflux TimeDiameter cmsComments                          Yes                                   +--------------+---------+------+-----------+------------+--------+  CFV          no                                              +--------------+---------+------+-----------+------------+--------+  FV prox       no                                              +--------------+---------+------+-----------+------------+--------+  FV mid        no                                               +--------------+---------+------+-----------+------------+--------+  FV dist       no                                              +--------------+---------+------+-----------+------------+--------+  Popliteal    no                                              +--------------+---------+------+-----------+------------+--------+  GSV at SFJ              yes                 .92               +--------------+---------+------+-----------+------------+--------+  GSV prox thigh          yes                 .68               +--------------+---------+------+-----------+------------+--------+  GSV mid thigh           yes                 .59               +--------------+---------+------+-----------+------------+--------+  GSV dist thigh          yes                 .51               +--------------+---------+------+-----------+------------+--------+  GSV at knee             yes                 .53               +--------------+---------+------+-----------+------------+--------+  GSV prox calf           yes                 .27               +--------------+---------+------+-----------+------------+--------+  GSV mid calf            yes                 .16               +--------------+---------+------+-----------+------------+--------+  SSV prox calf no                            .20               +--------------+---------+------+-----------+------------+--------+         Summary:  Right:  - No evidence of deep vein thrombosis seen in the right lower extremity,  from the common femoral through the popliteal veins.  - No evidence of superficial venous thrombosis in the right lower  extremity.  - No evidence of superficial  venous reflux seen in the right short  saphenous vein.  - Venous reflux is noted in the right greater saphenous vein in the thigh.  - Venous reflux is noted in the right greater saphenous vein in the  calf.    *See table(s) above for measurements and observations.   Electronically signed by Debby Robertson on 11/27/2023 at 2:47:38 PM.   Debby SAILOR. Robertson, MD FACS [facility] Phone Number: [phone] 11/27/2023 4:22 PM   Total time spent on preparing this encounter including chart review, data review, collecting history, examining the patient, and coordinating care: 45 min  Portions of this report may have been transcribed using voice recognition software.  Every effort has been made to ensure accuracy; however, inadvertent computerized transcription errors may still be present.

## 2024-01-29 ENCOUNTER — Ambulatory Visit
Admission: RE | Admit: 2024-01-29 | Discharge: 2024-01-29 | Disposition: A | Source: Ambulatory Visit | Attending: Internal Medicine

## 2024-01-29 ENCOUNTER — Other Ambulatory Visit: Payer: Self-pay | Admitting: Internal Medicine

## 2024-01-29 DIAGNOSIS — I872 Venous insufficiency (chronic) (peripheral): Secondary | ICD-10-CM | POA: Diagnosis not present

## 2024-01-29 DIAGNOSIS — M79644 Pain in right finger(s): Secondary | ICD-10-CM

## 2024-01-29 DIAGNOSIS — S6991XA Unspecified injury of right wrist, hand and finger(s), initial encounter: Secondary | ICD-10-CM | POA: Diagnosis not present

## 2024-01-29 DIAGNOSIS — J988 Other specified respiratory disorders: Secondary | ICD-10-CM | POA: Diagnosis not present

## 2024-01-29 DIAGNOSIS — M79641 Pain in right hand: Secondary | ICD-10-CM | POA: Diagnosis not present

## 2024-01-29 DIAGNOSIS — M810 Age-related osteoporosis without current pathological fracture: Secondary | ICD-10-CM | POA: Diagnosis not present

## 2024-02-12 DIAGNOSIS — M810 Age-related osteoporosis without current pathological fracture: Secondary | ICD-10-CM | POA: Diagnosis not present

## 2024-02-13 ENCOUNTER — Telehealth (HOSPITAL_COMMUNITY): Payer: Self-pay | Admitting: Pharmacist

## 2024-02-13 NOTE — Telephone Encounter (Signed)
 Received fax from [facility] with necessary labs to proceed with Prolia  on 03/07/2024  Calcium wnl at 10.1 mg/dL on 87/83/7974  Sherry Pennant, PharmD, MPH, BCPS, CPP Clinical Pharmacist

## 2024-02-29 ENCOUNTER — Encounter: Payer: Self-pay | Admitting: Family

## 2024-03-07 ENCOUNTER — Ambulatory Visit

## 2024-03-10 ENCOUNTER — Encounter: Payer: Self-pay | Admitting: Family

## 2024-03-10 ENCOUNTER — Ambulatory Visit

## 2024-03-10 ENCOUNTER — Telehealth: Payer: Self-pay | Admitting: Pharmacy Technician

## 2024-03-10 VITALS — BP 135/86 | HR 88 | Temp 97.7°F | Resp 16 | Ht 61.0 in | Wt 126.8 lb

## 2024-03-10 DIAGNOSIS — M810 Age-related osteoporosis without current pathological fracture: Secondary | ICD-10-CM

## 2024-03-10 MED ORDER — DENOSUMAB 60 MG/ML ~~LOC~~ SOSY
60.0000 mg | PREFILLED_SYRINGE | Freq: Once | SUBCUTANEOUS | Status: AC
  Administered 2024-03-10: 60 mg via SUBCUTANEOUS
  Filled 2024-03-10: qty 1

## 2024-03-10 NOTE — Telephone Encounter (Signed)
 Patient will be scheduled as soon as possible.  Auth Submission: APPROVED Site of care: Site of care: [facility] Payer: [employer]  ID: [id number] Medication & CPT/J Code(s) submitted: Prolia  (Denosumab ) G9102 Diagnosis Code:  Route of submission (phone, fax, portal):  Phone # [phone] Fax # Auth type: Buy/Bill PB Units/visits requested: 60MG  Q6 MONTHS Reference number: [id number] Approval from: 03/07/24 to 02/26/25   Auth letter has incorrect SOC.(Despite correct address on the auth form)   I have called [employer] and a new letter will be sent with updated address ([facility] [address]) Rep: Mark-P

## 2024-03-10 NOTE — Progress Notes (Signed)
 Diagnosis: Osteoporosis  Provider:  Chilton Greathouse MD  Procedure: Injection  Prolia (Denosumab), Dose: 60 mg, Site: subcutaneous, Number of injections: 1  Injection Site(s): Left arm  Post Care: Patient declined observation  Discharge: Condition: Good, Destination: Home . AVS Provided  Performed by:  Loney Hering, LPN

## 2024-09-08 ENCOUNTER — Ambulatory Visit
